# Patient Record
Sex: Female | Born: 2000 | Race: White | Hispanic: No | Marital: Single | Smoking: Never smoker
Health system: Southern US, Community
[De-identification: ages and names within clinical notes are randomized; demographics above are authoritative.]

## PROBLEM LIST (undated history)

## (undated) DIAGNOSIS — J302 Other seasonal allergic rhinitis: Secondary | ICD-10-CM

## (undated) DIAGNOSIS — N200 Calculus of kidney: Secondary | ICD-10-CM

## (undated) DIAGNOSIS — F419 Anxiety disorder, unspecified: Secondary | ICD-10-CM

## (undated) HISTORY — PX: OTHER SURGICAL HISTORY: SHX169

---
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
## 2022-12-16 IMAGING — CT CT RENAL STONE PROTOCOL
2 of 4 series · 16 of 46 positions shown, 18 images · non-contrast
Comparison: May 19, 2018

CLINICAL DATA: Left flank pain.

EXAM:
CT ABDOMEN AND PELVIS WITHOUT CONTRAST
TECHNIQUE: Multidetector CT imaging of the abdomen and pelvis was performed
following the standard protocol without IV contrast.

[Series 2: axial st · axial · 0.80mm/px · z∈[+745,+1170]mm · 13 of 95 slices shown, 15 images]
[im 5/95  soft-tissue]
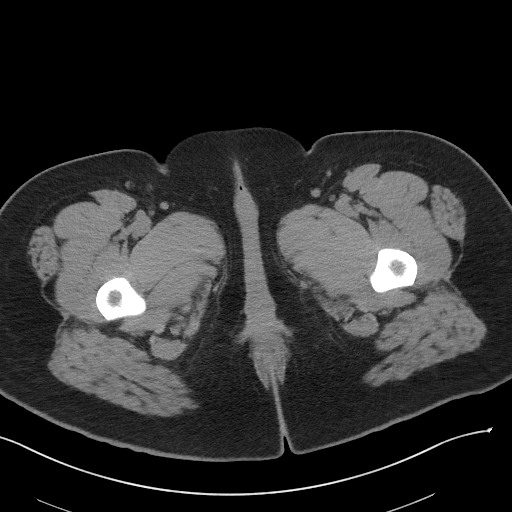
[im 5/95  bone]
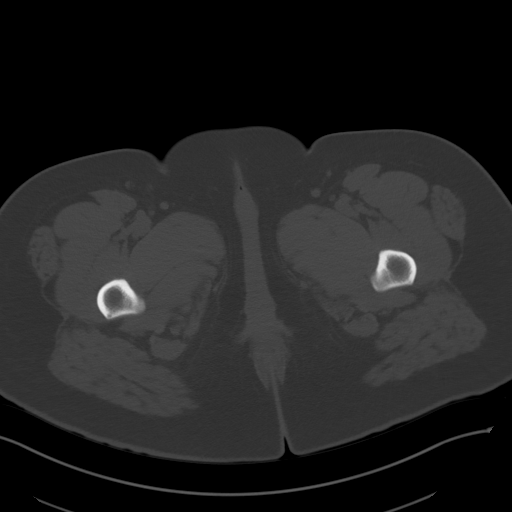
[im 13/95  soft-tissue]
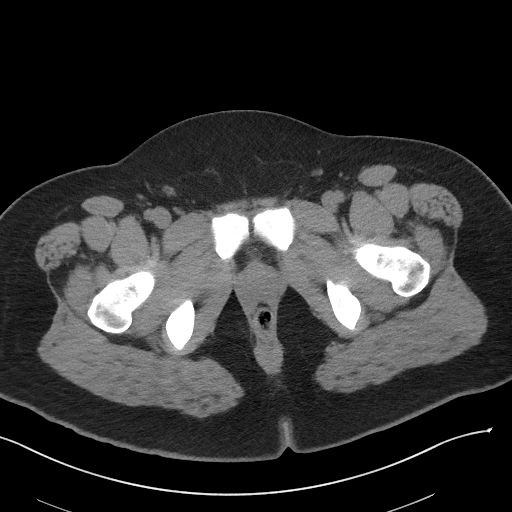
[im 21/95  soft-tissue]
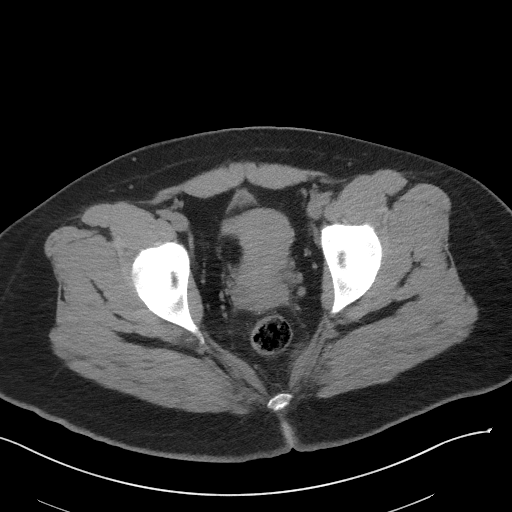
[im 25/95  soft-tissue]
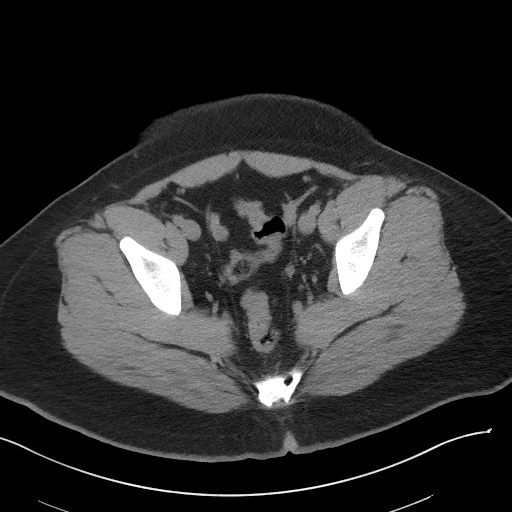
[im 33/95  soft-tissue]
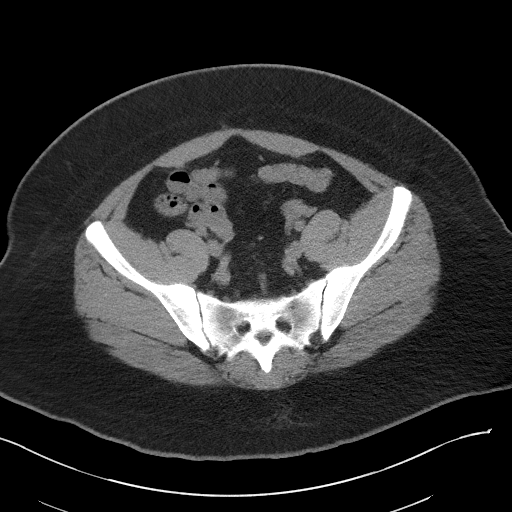
[im 41/95  soft-tissue]
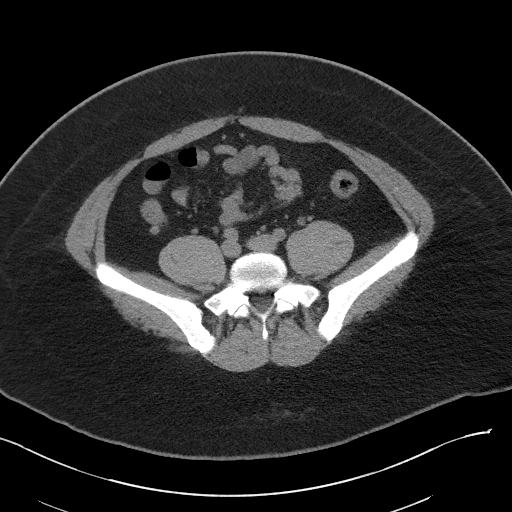
[im 50/95  soft-tissue]
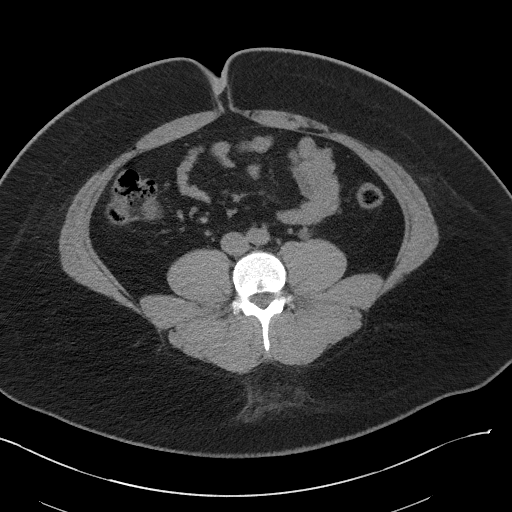
[im 54/95  soft-tissue]
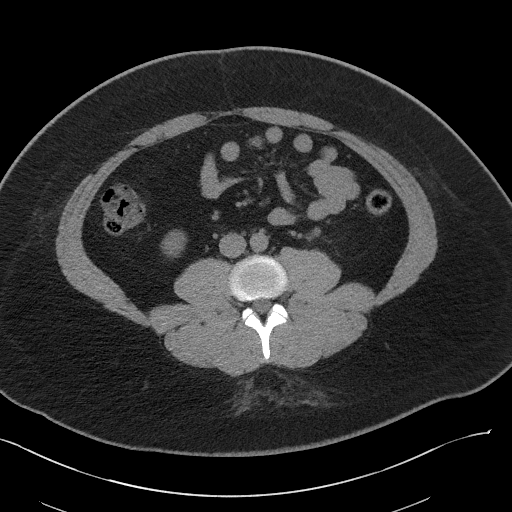
[im 62/95  soft-tissue]
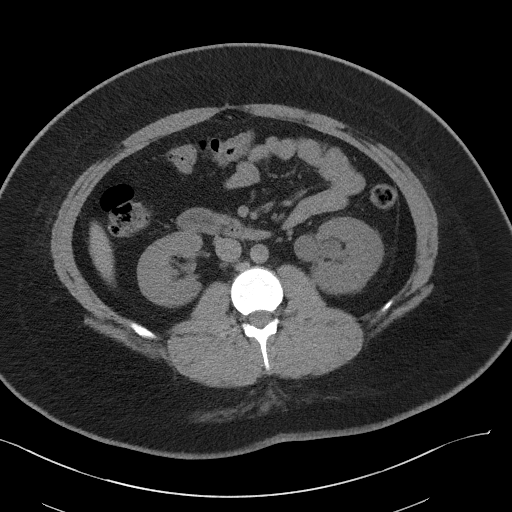
[im 62/95  bone]
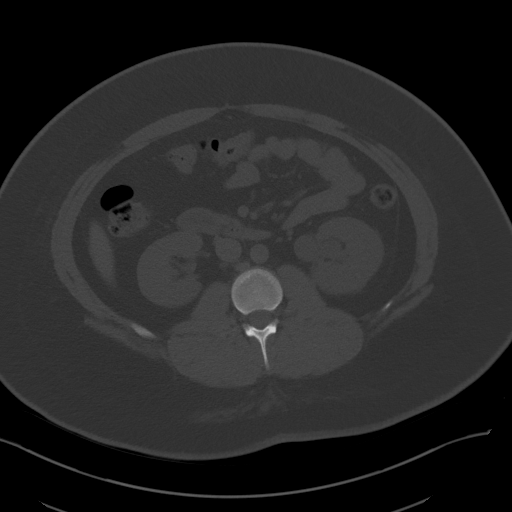
[im 70/95  soft-tissue]
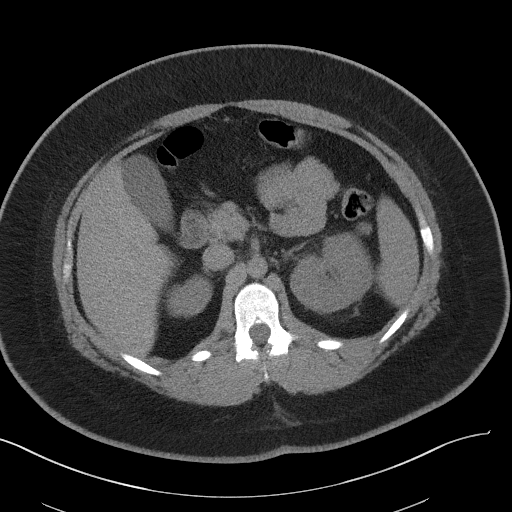
[im 74/95  soft-tissue]
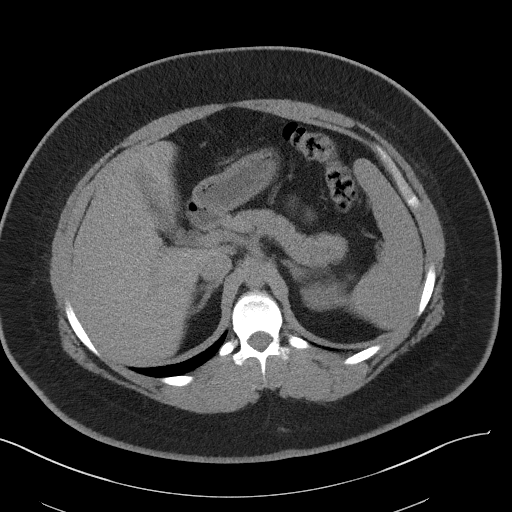
[im 82/95  soft-tissue]
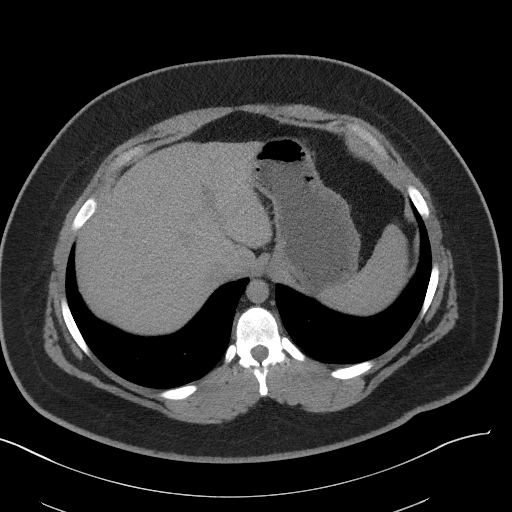
[im 90/95  soft-tissue]
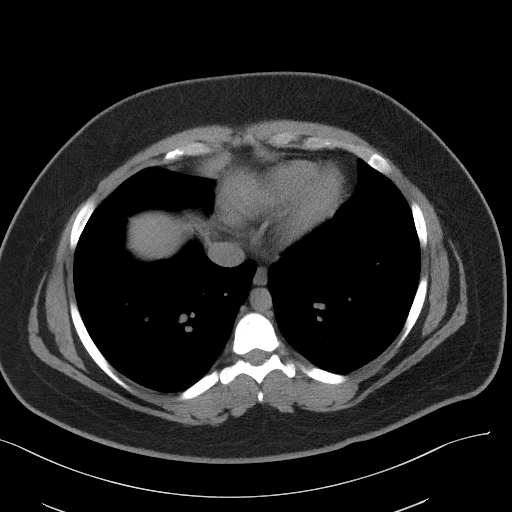

[Series 5: coronal st · coronal · 0.97mm/px · 3 of 124 slices shown]
[im 42/124  soft-tissue]
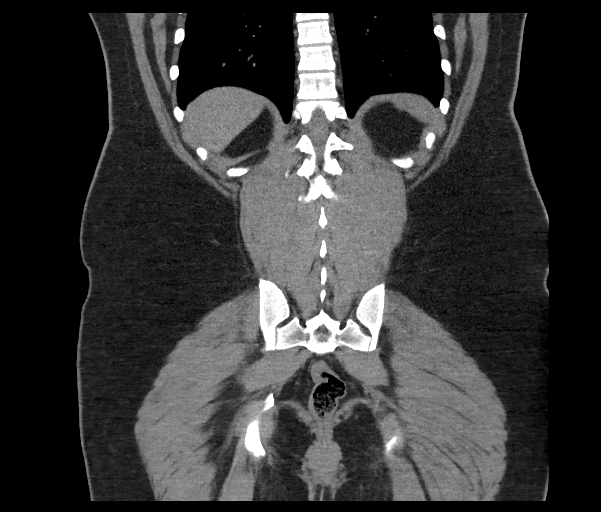
[im 55/124  soft-tissue]
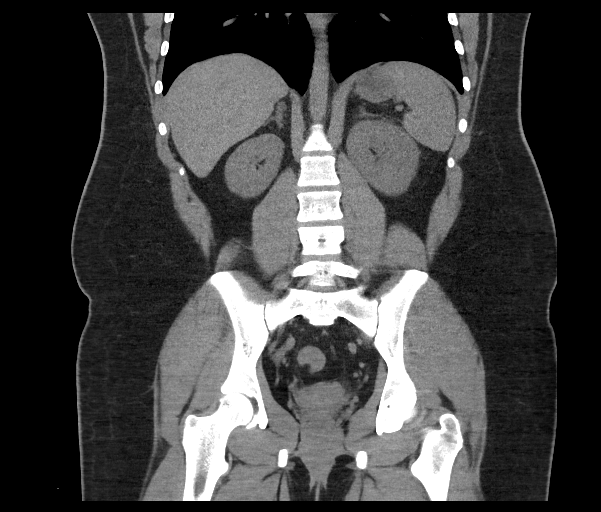
[im 69/124  soft-tissue]
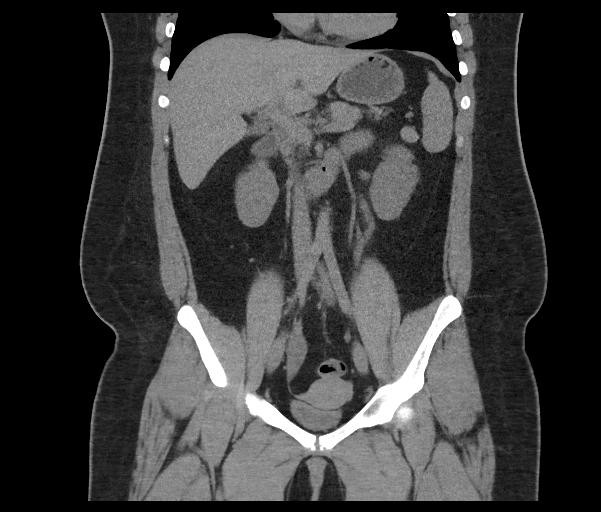

[16 of 46 positions shown; findings below may reference images not displayed]

FINDINGS: Lower chest: No acute abnormality.

Hepatobiliary: No focal liver abnormality is seen. No gallstones,
gallbladder wall thickening, or biliary dilatation.

Pancreas: Unremarkable. No pancreatic ductal dilatation or
surrounding inflammatory changes.

Spleen: Normal in size without focal abnormality.

Adrenals/Urinary Tract: Adrenal glands are unremarkable. Kidneys are
normal in size without focal lesions. A 4 mm nonobstructing renal
stone is seen within the mid left kidney. Adjacent 3 mm and 4 mm
obstructing renal stones are seen within the distal left ureter.
There is mild to moderate severity left-sided hydronephrosis and
hydroureter. Bladder is unremarkable.

Stomach/Bowel: Stomach is within normal limits. Appendix appears
normal. No evidence of bowel wall thickening, distention, or
inflammatory changes.

Vascular/Lymphatic: No significant vascular findings are present. No
enlarged abdominal or pelvic lymph nodes.

Reproductive: Uterus and bilateral adnexa are unremarkable.

Other: No abdominal wall hernia or abnormality. No abdominopelvic
ascites.

Musculoskeletal: No acute or significant osseous findings.
IMPRESSION: 1. 3 mm and 4 mm obstructing renal stones within the distal left
ureter.
2. 4 mm nonobstructing renal stone within the left kidney.
